# Patient Record
Sex: Male | Born: 1970 | Race: White | Hispanic: No | Marital: Single | Smoking: Never smoker
Health system: Southern US, Community
[De-identification: ages and names within clinical notes are randomized; demographics above are authoritative.]

## PROBLEM LIST (undated history)

## (undated) DIAGNOSIS — J45909 Unspecified asthma, uncomplicated: Secondary | ICD-10-CM

## (undated) HISTORY — PX: WISDOM TOOTH EXTRACTION: SHX21

---

## 2006-02-13 ENCOUNTER — Ambulatory Visit: Payer: Self-pay | Admitting: Internal Medicine

## 2006-02-25 ENCOUNTER — Ambulatory Visit: Payer: Self-pay | Admitting: Urology

## 2006-03-06 ENCOUNTER — Ambulatory Visit: Payer: Self-pay | Admitting: Urology

## 2006-03-12 ENCOUNTER — Ambulatory Visit: Payer: Self-pay | Admitting: Internal Medicine

## 2015-06-28 ENCOUNTER — Other Ambulatory Visit: Payer: Self-pay | Admitting: Family Medicine

## 2015-06-28 ENCOUNTER — Ambulatory Visit
Admission: RE | Admit: 2015-06-28 | Discharge: 2015-06-28 | Disposition: A | Discharge status: Home / Self Care | Payer: BC Managed Care – PPO | Source: Ambulatory Visit | Attending: Family Medicine | Admitting: Family Medicine

## 2015-06-28 DIAGNOSIS — R059 Cough, unspecified: Secondary | ICD-10-CM

## 2015-06-28 DIAGNOSIS — R05 Cough: Secondary | ICD-10-CM

## 2015-06-28 DIAGNOSIS — R0602 Shortness of breath: Secondary | ICD-10-CM | POA: Insufficient documentation

## 2015-10-03 ENCOUNTER — Emergency Department
Admission: EM | Admit: 2015-10-03 | Discharge: 2015-10-04 | Disposition: A | Discharge status: Home / Self Care | Payer: BC Managed Care – PPO | Attending: Emergency Medicine | Admitting: Emergency Medicine

## 2015-10-03 ENCOUNTER — Emergency Department: Payer: BC Managed Care – PPO

## 2015-10-03 DIAGNOSIS — R0789 Other chest pain: Secondary | ICD-10-CM | POA: Insufficient documentation

## 2015-10-03 LAB — CBC WITH DIFFERENTIAL/PLATELET
BASOS ABS: 0.1 10*3/uL (ref 0–0.1)
Basophils Relative: 1 %
EOS PCT: 2 %
Eosinophils Absolute: 0.2 10*3/uL (ref 0–0.7)
HEMATOCRIT: 37.6 % — AB (ref 40.0–52.0)
HEMOGLOBIN: 12.5 g/dL — AB (ref 13.0–18.0)
LYMPHS ABS: 2.9 10*3/uL (ref 1.0–3.6)
LYMPHS PCT: 23 %
MCH: 29.4 pg (ref 26.0–34.0)
MCHC: 33.3 g/dL (ref 32.0–36.0)
MCV: 88.2 fL (ref 80.0–100.0)
Monocytes Absolute: 1 10*3/uL (ref 0.2–1.0)
Monocytes Relative: 8 %
NEUTROS ABS: 8.3 10*3/uL — AB (ref 1.4–6.5)
NEUTROS PCT: 66 %
PLATELETS: 248 10*3/uL (ref 150–440)
RBC: 4.26 MIL/uL — AB (ref 4.40–5.90)
RDW: 13.1 % (ref 11.5–14.5)
WBC: 12.7 10*3/uL — AB (ref 3.8–10.6)

## 2015-10-03 LAB — BASIC METABOLIC PANEL
ANION GAP: 9 (ref 5–15)
BUN: 21 mg/dL — AB (ref 6–20)
CHLORIDE: 104 mmol/L (ref 101–111)
CO2: 22 mmol/L (ref 22–32)
Calcium: 8.9 mg/dL (ref 8.9–10.3)
Creatinine, Ser: 1.17 mg/dL (ref 0.61–1.24)
GFR calc Af Amer: >60 mL/min (ref >60)
Glucose, Bld: 98 mg/dL (ref 65–99)
POTASSIUM: 3.9 mmol/L (ref 3.5–5.1)
SODIUM: 135 mmol/L (ref 135–145)

## 2015-10-03 LAB — TROPONIN I: Troponin I: <0.03 ng/mL (ref <0.031)

## 2015-10-03 MED ORDER — SODIUM CHLORIDE 0.9 % IV BOLUS (SEPSIS)
1000.0000 mL | Freq: Once | INTRAVENOUS | Status: AC
  Administered 2015-10-03: 1000 mL via INTRAVENOUS

## 2015-10-03 MED ORDER — ASPIRIN 81 MG PO CHEW
324.0000 mg | CHEWABLE_TABLET | Freq: Once | ORAL | Status: AC
  Administered 2015-10-03: 324 mg via ORAL
  Filled 2015-10-03: qty 4

## 2015-10-03 NOTE — ED Notes (Signed)
Pt reports he was at church and reports CP and dizziness. Pt in no acute distress, MD at bedside

## 2015-10-03 NOTE — ED Provider Notes (Signed)
[facility] Emergency Department Provider Note ____________________________________________  Time seen: Approximately 9:44 PM  I have reviewed the triage vital signs and the nursing notes.   HISTORY  Chief Complaint Dizziness   HPI Dwayne Hooper is a 45 y.o. male with history of hypertension who presents with chest pain that started at 8 PM while he was sitting in church. The chest pain was left-sided and felt like sharp "pulsations"; it was intermittent for about 30 minutes. It did not radiate. He did have some shortness of breath but denies nausea and diaphoresis. He went to urinate and washed his face then went out to his car and felt dizzy so he returned to church and sat down. He is pain-free at this time but still feels lightheaded. He has never had similar symptoms in the past.  He has been drinking plenty of water but has been eating last and attempt to lose weight. He was at work earlier; works in [employer] but was inside.  No past medical history on file.  There are no active problems to display for this patient.   No past surgical history on file.  No current outpatient prescriptions on file.  Allergies Review of patient's allergies indicates no known allergies.  No family history on file.  Social History Social History  Substance Use Topics  . Smoking status: Not on file  . Smokeless tobacco: Not on file  . Alcohol Use: Not on file    Review of Systems Constitutional: No fever Eyes: No visual changes. ENT: No sore throat. Cardiovascular: Denies chest pain. Respiratory: Denies shortness of breath. Gastrointestinal: No abdominal pain.  No nausea, no vomiting.  No diarrhea.  No constipation. Genitourinary: Negative for dysuria. Musculoskeletal: Negative for back pain. Skin: Negative for rash. Neurological: Negative for headaches, focal weakness or numbness.  10-point ROS otherwise  negative.  ____________________________________________   PHYSICAL EXAM:  VITAL SIGNS:  Filed Vitals:   10/03/15 2155 10/03/15 2230  BP: 127/77 120/70  Pulse: 75 59  Resp: 18 21   Constitutional: Alert and oriented. Well appearing and in no acute distress. Eyes: Conjunctivae are normal. PERRL. EOMI. Head: Atraumatic. Nose: No congestion/rhinnorhea. Mouth/Throat: Mucous membranes are moist.  Oropharynx non-erythematous. Neck: No stridor.   Cardiovascular: Normal rate, regular rhythm. Grossly normal heart sounds.  Good peripheral circulation. Respiratory: Normal respiratory effort.  No retractions. Lungs CTAB. Gastrointestinal: Soft and nontender. No distention. No abdominal bruits. No CVA tenderness. Musculoskeletal: No lower extremity tenderness nor edema.   Neurologic:  Normal speech and language. No gross focal neurologic deficits are appreciated. No gait instability. Skin:  Skin is warm, dry and intact. No rash noted. Psychiatric: Mood and affect are normal. Speech and behavior are normal.  ____________________________________________   LABS (all labs ordered are listed, but only abnormal results are displayed)  Labs Reviewed  BASIC METABOLIC PANEL - Abnormal; Notable for the following:    BUN 21 (*)    All other components within normal limits  CBC WITH DIFFERENTIAL/PLATELET - Abnormal; Notable for the following:    WBC 12.7 (*)    RBC 4.26 (*)    Hemoglobin 12.5 (*)    HCT 37.6 (*)    Neutro Abs 8.3 (*)    All other components within normal limits  TROPONIN I   ____________________________________________  EKG  ED ECG REPORT I, Niamya Vittitow,  Rozetta Stumpp, the attending physician, personally viewed and interpreted this ECG.   Date: 10/03/2015  EKG Time: 2145  Rate: 73  Rhythm:  nsr  Axis: nl  Intervals:nl  ST&T Change: none  ____________________________________________  RADIOLOGY  cxr-IMPRESSION: No active cardiopulmonary  disease.  ____________________________________________   INITIAL IMPRESSION / ASSESSMENT AND PLAN / ED COURSE  Pertinent labs & imaging results that were available during my care of the patient were reviewed by me and considered in my medical decision making (see chart for details).  ----------------------------------------- 11:33 PM on 10/03/2015 -----------------------------------------  Patient remains pain-free. Will repeat troponin 3 hours after initial troponin which will be at 1 AM. ____________________________________________   FINAL CLINICAL IMPRESSION(S) / ED DIAGNOSES Chest pain  New prescriptions started this visit New Prescriptions   No medications on file     Ponciano Ort, MD 10/04/15 0030

## 2015-10-04 LAB — TROPONIN I

## 2015-10-04 NOTE — ED Provider Notes (Signed)
-----------------------------------------   2:04 AM on 10/04/2015 -----------------------------------------  Repeat troponin remains negative. Patient resting comfortably without complaints. Will refer for outpatient cardiology follow-up. Strict return precautions given. Patient verbalizes understanding and agrees with plan of care.  Paulette Blanch, MD 10/04/15 [phone]

## 2015-10-04 NOTE — Discharge Instructions (Signed)
1. Continue all medicines as directed by your doctor. 2. Return to the ER for worsening symptoms, persistent vomiting, difficulty breathing or other concerns.  Nonspecific Chest Pain  Chest pain can be caused by many different conditions. There is always a chance that your pain could be related to something serious, such as a heart attack or a blood clot in your lungs. Chest pain can also be caused by conditions that are not life-threatening. If you have chest pain, it is very important to follow up with your health care provider. CAUSES  Chest pain can be caused by:  Heartburn.  Pneumonia or bronchitis.  Anxiety or stress.  Inflammation around your heart (pericarditis) or lung (pleuritis or pleurisy).  A blood clot in your lung.  A collapsed lung (pneumothorax). It can develop suddenly on its own (spontaneous pneumothorax) or from trauma to the chest.  Shingles infection (varicella-zoster virus).  Heart attack.  Damage to the bones, muscles, and cartilage that make up your chest wall. This can include:  Bruised bones due to injury.  Strained muscles or cartilage due to frequent or repeated coughing or overwork.  Fracture to one or more ribs.  Sore cartilage due to inflammation (costochondritis). RISK FACTORS  Risk factors for chest pain may include:  Activities that increase your risk for trauma or injury to your chest.  Respiratory infections or conditions that cause frequent coughing.  Medical conditions or overeating that can cause heartburn.  Heart disease or family history of heart disease.  Conditions or health behaviors that increase your risk of developing a blood clot.  Having had chicken pox (varicella zoster). SIGNS AND SYMPTOMS Chest pain can feel like:  Burning or tingling on the surface of your chest or deep in your chest.  Crushing, pressure, aching, or squeezing pain.  Dull or sharp pain that is worse when you move, cough, or take a deep  breath.  Pain that is also felt in your back, neck, shoulder, or arm, or pain that spreads to any of these areas. Your chest pain may come and go, or it may stay constant. DIAGNOSIS Lab tests or other studies may be needed to find the cause of your pain. Your health care provider may have you take a test called an ambulatory ECG (electrocardiogram). An ECG records your heartbeat patterns at the time the test is performed. You may also have other tests, such as:  Transthoracic echocardiogram (TTE). During echocardiography, sound waves are used to create a picture of all of the heart structures and to look at how blood flows through your heart.  Transesophageal echocardiogram (TEE).This is a more advanced imaging test that obtains images from inside your body. It allows your health care provider to see your heart in finer detail.  Cardiac monitoring. This allows your health care provider to monitor your heart rate and rhythm in real time.  Holter monitor. This is a portable device that records your heartbeat and can help to diagnose abnormal heartbeats. It allows your health care provider to track your heart activity for several days, if needed.  Stress tests. These can be done through exercise or by taking medicine that makes your heart beat more quickly.  Blood tests.  Imaging tests. TREATMENT  Your treatment depends on what is causing your chest pain. Treatment may include:  Medicines. These may include:  Acid blockers for heartburn.  Anti-inflammatory medicine.  Pain medicine for inflammatory conditions.  Antibiotic medicine, if an infection is present.  Medicines to dissolve blood clots.  Medicines to treat coronary artery disease.  Supportive care for conditions that do not require medicines. This may include:  Resting.  Applying heat or cold packs to injured areas.  Limiting activities until pain decreases. HOME CARE INSTRUCTIONS  If you were prescribed an  antibiotic medicine, finish it all even if you start to feel better.  Avoid any activities that bring on chest pain.  Do not use any tobacco products, including cigarettes, chewing tobacco, or electronic cigarettes. If you need help quitting, ask your health care provider.  Do not drink alcohol.  Take medicines only as directed by your health care provider.  Keep all follow-up visits as directed by your health care provider. This is important. This includes any further testing if your chest pain does not go away.  If heartburn is the cause for your chest pain, you may be told to keep your head raised (elevated) while sleeping. This reduces the chance that acid will go from your stomach into your esophagus.  Make lifestyle changes as directed by your health care provider. These may include:  Getting regular exercise. Ask your health care provider to suggest some activities that are safe for you.  Eating a heart-healthy diet. A registered dietitian can help you to learn healthy eating options.  Maintaining a healthy weight.  Managing diabetes, if necessary.  Reducing stress. SEEK MEDICAL CARE IF:  Your chest pain does not go away after treatment.  You have a rash with blisters on your chest.  You have a fever. SEEK IMMEDIATE MEDICAL CARE IF:   Your chest pain is worse.  You have an increasing cough, or you cough up blood.  You have severe abdominal pain.  You have severe weakness.  You faint.  You have chills.  You have sudden, unexplained chest discomfort.  You have sudden, unexplained discomfort in your arms, back, neck, or jaw.  You have shortness of breath at any time.  You suddenly start to sweat, or your skin gets clammy.  You feel nauseous or you vomit.  You suddenly feel light-headed or dizzy.  Your heart begins to beat quickly, or it feels like it is skipping beats. These symptoms may represent a serious problem that is an emergency. Do not wait to  see if the symptoms will go away. Get medical help right away. Call your local emergency services (911 in the [place] Do not drive yourself to the hospital.   This information is not intended to replace advice given to you by your health care provider. Make sure you discuss any questions you have with your health care provider.   Document Released: 03/26/2005 Document Revised: 07/07/2014 Document Reviewed: 01/20/2014 Elsevier Interactive Patient Education [employer].

## 2016-10-28 ENCOUNTER — Ambulatory Visit
Admission: EM | Admit: 2016-10-28 | Discharge: 2016-10-28 | Disposition: A | Discharge status: Home / Self Care | Payer: BC Managed Care – PPO | Attending: Family Medicine | Admitting: Family Medicine

## 2016-10-28 ENCOUNTER — Ambulatory Visit (INDEPENDENT_AMBULATORY_CARE_PROVIDER_SITE_OTHER): Payer: BC Managed Care – PPO

## 2016-10-28 DIAGNOSIS — M1711 Unilateral primary osteoarthritis, right knee: Secondary | ICD-10-CM

## 2016-10-28 MED ORDER — NAPROXEN 500 MG PO TABS: 500.0000 mg | ORAL_TABLET | Freq: Two times a day (BID) | ORAL | 0 refills | Status: AC

## 2016-10-28 NOTE — ED Provider Notes (Signed)
CSN: [id number]     Arrival date & time 10/28/16  1222 History   None    Chief Complaint  Patient presents with  . Knee Pain    right   (Consider location/radiation/quality/duration/timing/severity/associated sxs/prior Treatment) HPI  46 year old male who presents with a two-week history of right lateral knee pain. He denies any injury to the knee. Works in [employer] does a lot of climbing on [employer]. 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 He states his knee will pop on occasion it has not locked. He has not noticed much swelling. Any pressure on the knee causes him to have pain ; his gait has developed a decided limp. He is morbidly obese and has recently started an exercise program on a treadmill and lifting weights in an effort to lose weight. His knee hurt before the exercise program but has worsened since beginning exercise program.        History reviewed. No pertinent past medical history. History reviewed. No pertinent surgical history. Family History  Problem Relation Age of Onset  . Healthy Mother   . Healthy Father    Social History  Substance Use Topics  . Smoking status: Never Smoker  . Smokeless tobacco: Current User    Types: Snuff  . Alcohol use Yes     Comment: occasionally    Review of Systems  Constitutional: Positive for activity change. Negative for appetite change, chills, fatigue and fever.  Musculoskeletal: Positive for arthralgias.  All other systems reviewed and are negative.   Allergies  Patient has no known allergies.  Home Medications   Prior to Admission medications   Medication Sig Start Date End Date Taking? Authorizing Provider  lisinopril-hydrochlorothiazide (PRINZIDE,ZESTORETIC) 10-12.5 MG tablet Take 1 tablet by mouth daily. 09/06/15  Yes Historical Provider, MD  Multiple Vitamins-Minerals (MULTIVITAMIN WITH MINERALS) tablet Take 1 tablet by mouth daily.   Yes Historical Provider, MD  naproxen (NAPROSYN) 500 MG  tablet Take 1 tablet (500 mg total) by mouth 2 (two) times daily with a meal. 10/28/16   Lorin Picket, PA-C   Meds Ordered and Administered this Visit  Medications - No data to display  BP 120/72 (BP Location: Left Arm)   Pulse 61   Temp 98.7 F (37.1 C) (Oral)   Resp 17   Ht 5\' 11"  (1.803 m)   Wt (!) 335 lb (152 kg)   SpO2 98%   BMI 46.72 kg/m  No data found.   Physical Exam  Constitutional: He is oriented to person, place, and time. He appears well-developed and well-nourished. No distress.  HENT:  Head: Normocephalic and atraumatic.  Eyes: Pupils are equal, round, and reactive to light. Right eye exhibits no discharge. Left eye exhibits no discharge.  Neck: Normal range of motion.  Musculoskeletal: Normal range of motion. He exhibits tenderness. He exhibits no deformity.  Examination of the right knee shows no obvious effusion. He has  good quad control and strength. Patellofemoral joint is negative. Collateral ligaments are strong to testing. Patient's  maximum tenderness is over the lateral joint line of the right knee which reproduces pain. Flexion is full but with pain at the extremes.; he has discomfort at the extremes of extension. There is a positive McMurray's sign. Anterior drawer sign is negative. He does exhibit an antalgic gait.  Neurological: He is alert and oriented to person, place, and time.  Skin: Skin is warm and dry. He is not diaphoretic.  Psychiatric: He has  a normal mood and affect. His behavior is normal. Judgment and thought content normal.  Nursing note and vitals reviewed.   Urgent Care Course     Procedures (including critical care time)  Labs Review Labs Reviewed - No data to display  Imaging Review Dg Knee Complete 4 Views Right  Result Date: 10/28/2016 CLINICAL DATA:  46 year old male with 2 weeks of right knee pain with no known injury. Lateral joint region pain. EXAM: RIGHT KNEE - COMPLETE 4+ VIEW COMPARISON:  None. FINDINGS:  Weight-bearing views. Bone mineralization is within normal limits. Patella is intact. There is mild medial compartment joint space loss. There is minimal to mild tricompartmental degenerative spurring. Questionable trace joint effusion. No acute osseous abnormality identified. IMPRESSION: Mild right knee joint degeneration, maximal in the medial compartment. Possible trace joint effusion but no acute osseous abnormality identified. Electronically Signed   By: Genevie Ann M.D.   On: 10/28/2016 14:07     Visual Acuity Review  Right Eye Distance:   Left Eye Distance:   Bilateral Distance:    Right Eye Near:   Left Eye Near:    Bilateral Near:      The patient was fitted with a knee immobilizer   MDM   1. Primary osteoarthritis of right knee    Discharge Medication List as of 10/28/2016  2:39 PM    START taking these medications   Details  naproxen (NAPROSYN) 500 MG tablet Take 1 tablet (500 mg total) by mouth 2 (two) times daily with a meal., Starting Tue 10/28/2016, Normal      Plan: 1. Test/x-ray results and diagnosis reviewed with patient 2. rx as per orders; risks, benefits, potential side effects reviewed with patient 3. Recommend supportive treatment with Use of a knee immobilizer during activity.May remove the knee immobilizer when not active..Stop using ibuprofen and begin using the Naprosyn that I prescribed. Apply ice to the knee 20 minutes out of every 2 hours for comfort. If you are still having problems after a week of conservative care suggests follow-up with the orthopedic surgeon. Name and address were provided to the patient.   Lorin Picket, PA-C 10/28/16 1445

## 2016-10-28 NOTE — ED Triage Notes (Signed)
Patient complains of right knee pain. Patient states that pain is on the right side of right knee. Patient states that pain has started 2 weeks ago.

## 2016-11-03 DIAGNOSIS — M170 Bilateral primary osteoarthritis of knee: Secondary | ICD-10-CM | POA: Insufficient documentation

## 2016-11-03 DIAGNOSIS — M179 Osteoarthritis of knee, unspecified: Secondary | ICD-10-CM | POA: Insufficient documentation

## 2017-06-17 ENCOUNTER — Other Ambulatory Visit: Payer: Self-pay | Admitting: Family Medicine

## 2017-06-17 DIAGNOSIS — R1011 Right upper quadrant pain: Secondary | ICD-10-CM

## 2017-06-19 ENCOUNTER — Ambulatory Visit: Payer: BC Managed Care – PPO

## 2018-06-16 IMAGING — CR DG KNEE COMPLETE 4+V*R*
4 series · 4 of 4 positions shown · non-contrast
Comparison: None.

CLINICAL DATA: 46-year-old male with 2 weeks of right knee pain
with no known injury. Lateral joint region pain.

EXAM:
RIGHT KNEE - COMPLETE 4+ VIEW

[knee ap]
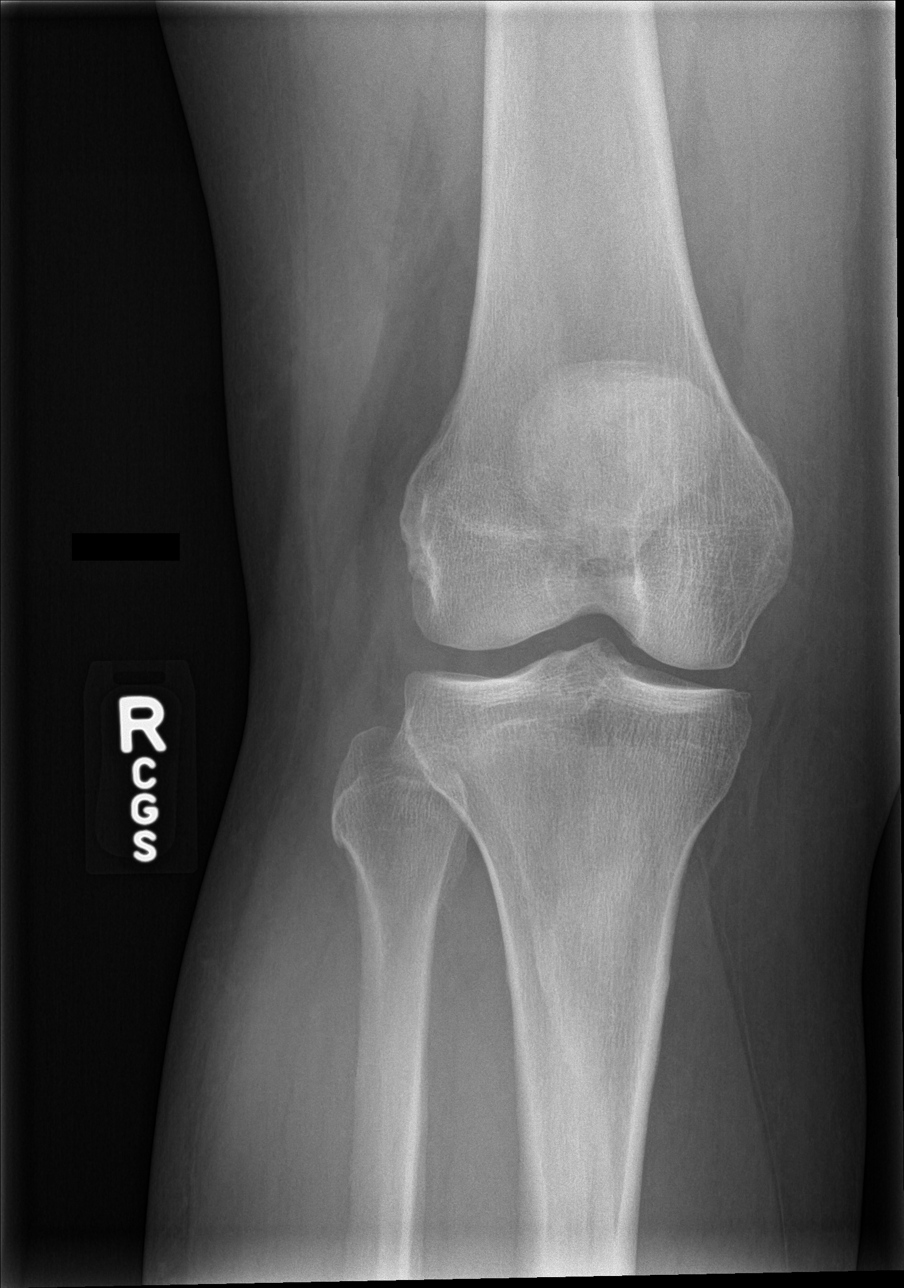

[knee lat]
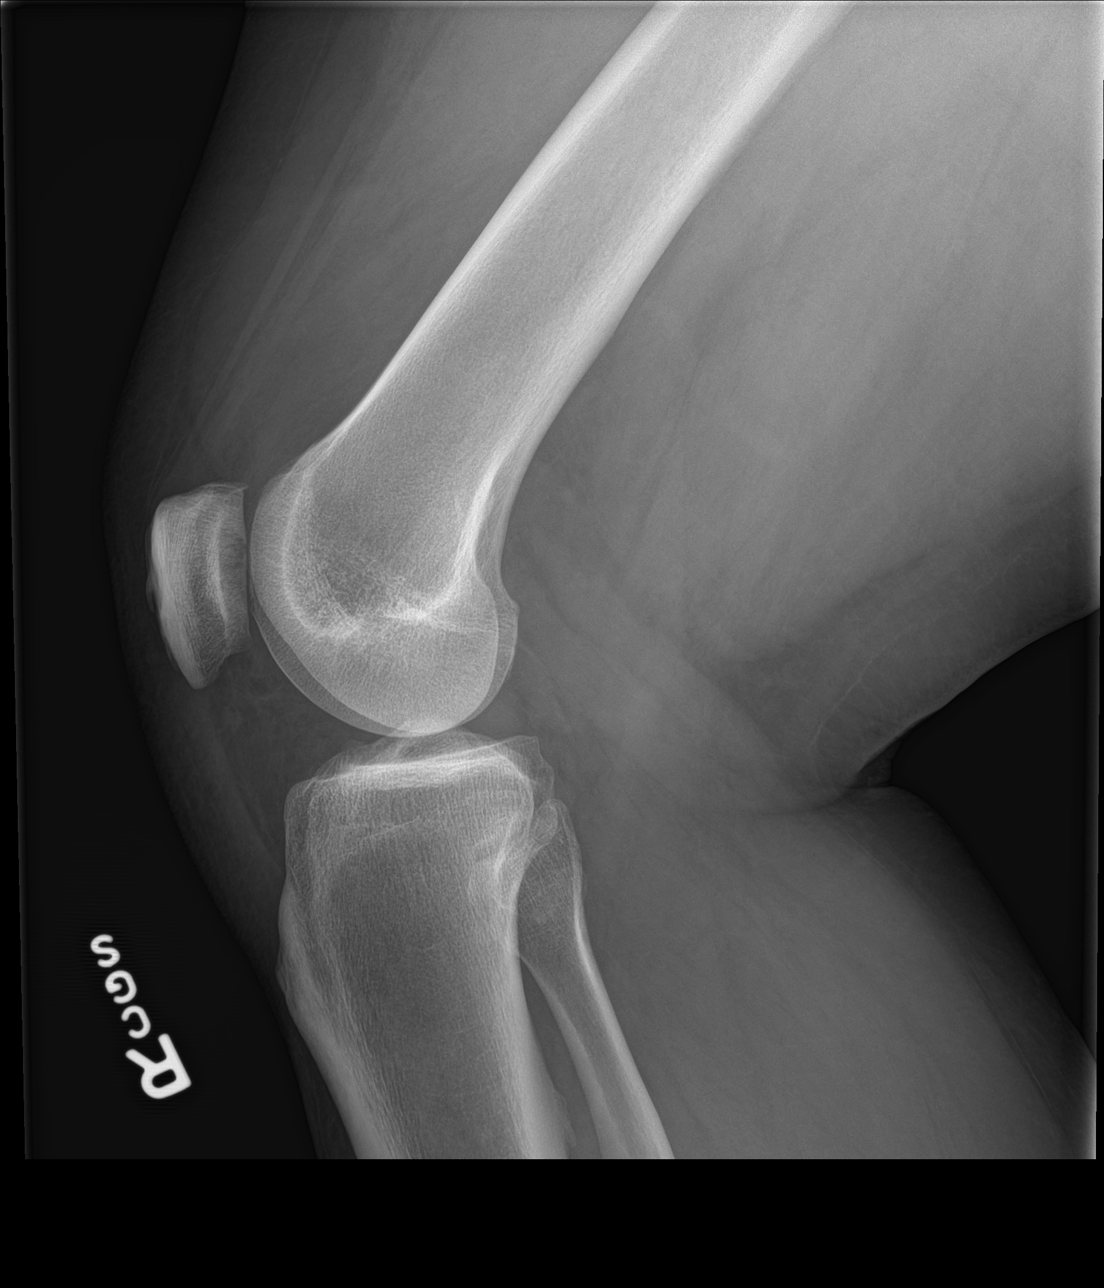

[tunnel]
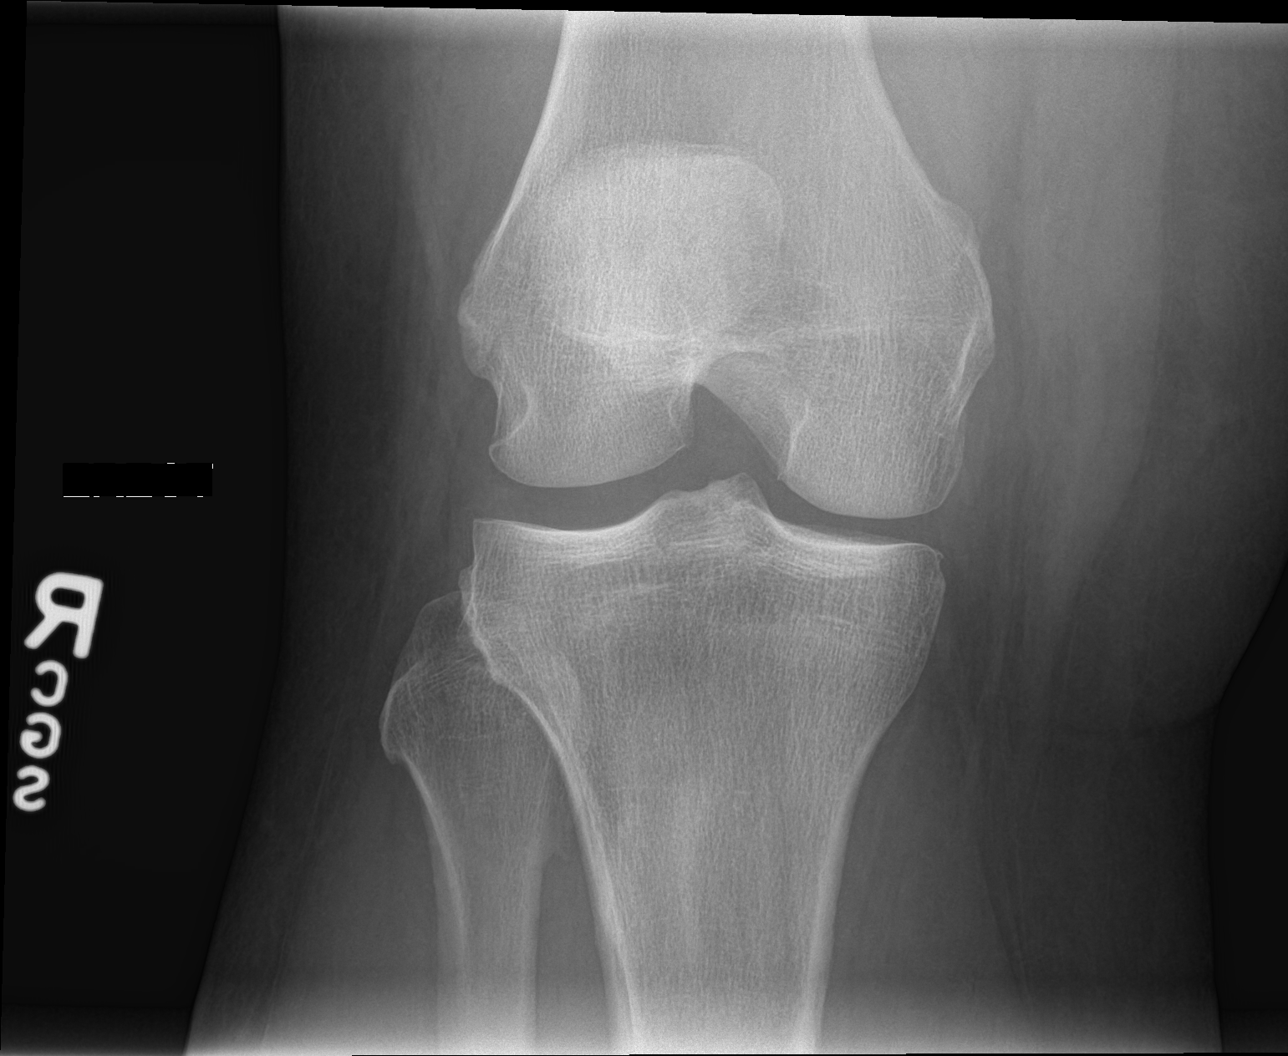

[patella skyline]
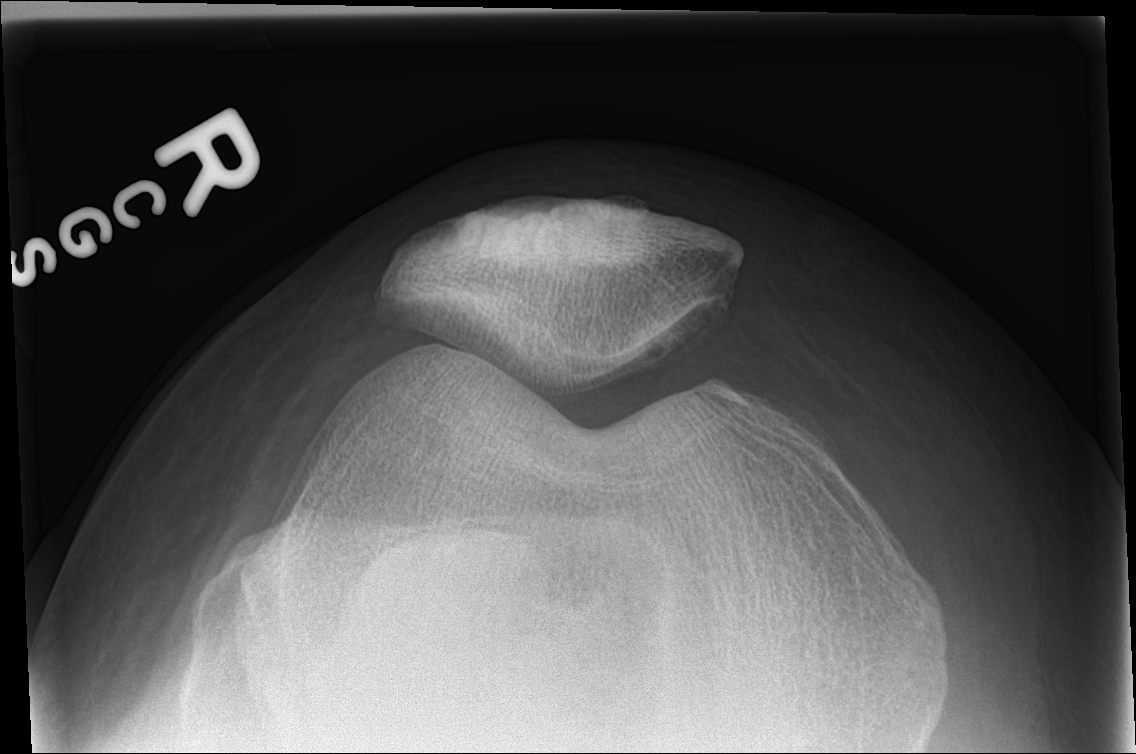

[4 of 4 positions shown; findings below may reference images not displayed]

FINDINGS: Weight-bearing views. Bone mineralization is within normal limits.
Patella is intact. There is mild medial compartment joint space
loss. There is minimal to mild tricompartmental degenerative
spurring. Questionable trace joint effusion. No acute osseous
abnormality identified.
IMPRESSION: Mild right knee joint degeneration, maximal in the medial
compartment. Possible trace joint effusion but no acute osseous
abnormality identified.

## 2021-05-01 ENCOUNTER — Ambulatory Visit
Admission: EM | Admit: 2021-05-01 | Discharge: 2021-05-01 | Disposition: A | Discharge status: Home / Self Care | Payer: BC Managed Care – PPO | Attending: Physician Assistant | Admitting: Physician Assistant

## 2021-05-01 ENCOUNTER — Encounter: Payer: Self-pay | Admitting: Emergency Medicine

## 2021-05-01 ENCOUNTER — Other Ambulatory Visit: Payer: Self-pay

## 2021-05-01 DIAGNOSIS — R051 Acute cough: Secondary | ICD-10-CM | POA: Insufficient documentation

## 2021-05-01 DIAGNOSIS — R062 Wheezing: Secondary | ICD-10-CM

## 2021-05-01 DIAGNOSIS — Z20822 Contact with and (suspected) exposure to covid-19: Secondary | ICD-10-CM | POA: Diagnosis not present

## 2021-05-01 DIAGNOSIS — F17220 Nicotine dependence, chewing tobacco, uncomplicated: Secondary | ICD-10-CM | POA: Insufficient documentation

## 2021-05-01 DIAGNOSIS — R059 Cough, unspecified: Secondary | ICD-10-CM

## 2021-05-01 DIAGNOSIS — Z791 Long term (current) use of non-steroidal anti-inflammatories (NSAID): Secondary | ICD-10-CM | POA: Diagnosis not present

## 2021-05-01 HISTORY — DX: Unspecified asthma, uncomplicated: J45.909

## 2021-05-01 LAB — SARS CORONAVIRUS 2 (TAT 6-24 HRS): SARS Coronavirus 2: NEGATIVE

## 2021-05-01 MED ORDER — METHYLPREDNISOLONE 4 MG PO TBPK: ORAL_TABLET | ORAL | 0 refills | Status: AC

## 2021-05-01 MED ORDER — ALBUTEROL SULFATE HFA 108 (90 BASE) MCG/ACT IN AERS: 1.0000 | INHALATION_SPRAY | Freq: Four times a day (QID) | RESPIRATORY_TRACT | 0 refills | Status: AC | PRN

## 2021-05-01 MED ORDER — METHYLPREDNISOLONE SODIUM SUCC 40 MG IJ SOLR
40.0000 mg | Freq: Once | INTRAMUSCULAR | Status: AC
  Administered 2021-05-01: 40 mg via INTRAMUSCULAR

## 2021-05-01 NOTE — ED Triage Notes (Signed)
Pt presents today with c/o of chest congestion, cough and wheezing that began 5 days ago. Home Covid test yesterday negative. Denies fever.

## 2021-05-01 NOTE — ED Provider Notes (Signed)
[facility]    CSN: [id number] Arrival date & time: 05/01/21  0813      History   Chief Complaint Chief Complaint  Patient presents with   Cough   Wheezing    HPI Dwayne Hooper is a 50 y.o. male who presents with onset of chest congestion and cough and wheezing x 5 days. Has not had a fever. Had negative covid test yesterday. Tends to get wheezing in the fall and has been using his neb q 4h x 2 days which helps. When he gets this way his PCP has given him steroid shot and PO prednisone. But his PCP is out this week. Has been pushing more fluids and coughing seems to help with wheezing.     Past Medical History:  Diagnosis Date   Asthma     There are no problems to display for this patient.   History reviewed. No pertinent surgical history.     Home Medications    Prior to Admission medications   Medication Sig Start Date End Date Taking? Authorizing Provider  albuterol (VENTOLIN HFA) 108 (90 Base) MCG/ACT inhaler Inhale 1-2 puffs into the lungs every 6 (six) hours as needed for wheezing or shortness of breath. 05/01/21  Yes Rodriguez-Southworth, Sunday Spillers, PA-C  methylPREDNISolone (MEDROL DOSEPAK) 4 MG TBPK tablet Take as directed 05/01/21  Yes Rodriguez-Southworth, Sunday Spillers, PA-C  lisinopril-hydrochlorothiazide (PRINZIDE,ZESTORETIC) 10-12.5 MG tablet Take 1 tablet by mouth daily. 09/06/15   [provider]  Multiple Vitamins-Minerals (MULTIVITAMIN WITH MINERALS) tablet Take 1 tablet by mouth daily.    [provider]  naproxen (NAPROSYN) 500 MG tablet Take 1 tablet (500 mg total) by mouth 2 (two) times daily with a meal. 10/28/16   Lorin Picket, PA-C    Family History Family History  Problem Relation Age of Onset   Healthy Mother    Healthy Father     Social History Social History   Tobacco Use   Smoking status: Never   Smokeless tobacco: Current    Types: Snuff  Substance Use Topics   Alcohol use: Yes    Comment: occasionally    Drug use: No     Allergies   Patient has no known allergies.   Review of Systems Review of Systems  Constitutional:  Negative for appetite change, chills, diaphoresis, fatigue and fever.  HENT:  Positive for postnasal drip, rhinorrhea and sneezing. Negative for congestion, ear discharge, ear pain and sore throat.   Eyes:  Negative for discharge.  Respiratory:  Positive for cough, shortness of breath and wheezing.   Musculoskeletal:  Negative for myalgias.  Skin:  Negative for rash.  Neurological:  Negative for headaches.    Physical Exam Triage Vital Signs ED Triage Vitals  Enc Vitals Group     BP 05/01/21 0826 123/82     Pulse Rate 05/01/21 0826 71     Resp 05/01/21 0826 18     Temp 05/01/21 0826 98.9 F (37.2 C)     Temp Source 05/01/21 0826 Oral     SpO2 05/01/21 0826 95 %     Weight --      Height --      Head Circumference --      Peak Flow --      Pain Score 05/01/21 0824 0     Pain Loc --      Pain Edu? --      Excl. in [place]? --    No data found.  Updated Vital Signs  BP 123/82 (BP Location: Right Arm)   Pulse 71   Temp 98.9 F (37.2 C) (Oral)   Resp 18   SpO2 95%   Visual Acuity Right Eye Distance:   Left Eye Distance:   Bilateral Distance:    Right Eye Near:   Left Eye Near:    Bilateral Near:     Physical Exam Physical Exam Constitutional:      General: He is not in acute distress.    Appearance: He is not toxic-appearing.  HENT:     Head: Normocephalic.     Right Ear: Tympanic membrane, ear canal and external ear normal.     Left Ear: Ear canal and external ear normal.     Nose: Nose normal.     Mouth/Throat:     Mouth: Mucous membranes are moist.     Pharynx: Oropharynx is clear.  Eyes:     General: No scleral icterus.    Conjunctiva/sclera: Conjunctivae normal.  Cardiovascular:     Rate and Rhythm: Normal rate and regular rhythm.     Heart sounds: No murmur heard.   Pulmonary:     Effort: Pulmonary effort is normal. No  respiratory distress.     Breath sounds: expiratory Wheezing present all out, but improve after coughing.      Musculoskeletal:        General: Normal range of motion.     Cervical back: Neck supple.  Lymphadenopathy:     Cervical: No cervical adenopathy.  Skin:    General: Skin is warm and dry.     Findings: No rash.  Neurological:     Mental Status: He is alert and oriented to person, place, and time.     Gait: Gait normal.  Psychiatric:        Mood and Affect: Mood normal.        Behavior: Behavior normal.        Thought Content: Thought content normal.        Judgment: Judgment normal.    UC Treatments / Results  Labs (all labs ordered are listed, but only abnormal results are displayed) Labs Reviewed  SARS CORONAVIRUS 2 (TAT 6-24 HRS)    EKG   Radiology No results found.  Procedures Procedures (including critical care time)  Medications Ordered in UC Medications  methylPREDNISolone sodium succinate (SOLU-MEDROL) 40 mg/mL injection 40 mg (has no administration in time range)    Initial Impression / Assessment and Plan / UC Course  I have reviewed the triage vital signs and the nursing notes. Has allergic bronchitis He was given Solumedrol 40 mg IM, and I sent Medrol dose pack. See instructions.   Final Clinical Impressions(s) / UC Diagnoses   Final diagnoses:  Wheezing  Cough, unspecified type     Discharge Instructions      Continue the nebulizer treatments every 4 hours for 5 more days and then as needed. I am sending an inhaler for you to have when you are at work.  For next year try Claritin one month before his happens and see if it prevents it from happening      ED Prescriptions     Medication Sig Dispense Auth. Provider   methylPREDNISolone (MEDROL DOSEPAK) 4 MG TBPK tablet Take as directed 21 tablet Rodriguez-Southworth, Sunday Spillers, PA-C   albuterol (VENTOLIN HFA) 108 (90 Base) MCG/ACT inhaler Inhale 1-2 puffs into the lungs every 6 (six)  hours as needed for wheezing or shortness of breath. 16 g Rodriguez-Southworth, [place], [place]  PDMP not reviewed this encounter.   Shelby Mattocks, PA-C 05/01/21 0900

## 2021-05-01 NOTE — Discharge Instructions (Addendum)
Continue the nebulizer treatments every 4 hours for 5 more days and then as needed. I am sending an inhaler for you to have when you are at work.  For next year try Claritin one month before his happens and see if it prevents it from happening

## 2022-04-01 ENCOUNTER — Encounter: Payer: Self-pay | Admitting: Family Medicine

## 2022-04-01 ENCOUNTER — Telehealth: Payer: Self-pay

## 2022-04-01 ENCOUNTER — Other Ambulatory Visit: Payer: Self-pay

## 2022-04-01 DIAGNOSIS — Z1211 Encounter for screening for malignant neoplasm of colon: Secondary | ICD-10-CM

## 2022-04-01 DIAGNOSIS — R195 Other fecal abnormalities: Secondary | ICD-10-CM

## 2022-04-01 MED ORDER — NA SULFATE-K SULFATE-MG SULF 17.5-3.13-1.6 GM/177ML PO SOLN: 1.0000 | Freq: Once | ORAL | 0 refills | Status: AC

## 2022-04-01 NOTE — Telephone Encounter (Signed)
Gastroenterology Pre-Procedure Review  Request Date: 05/16/22 Requesting Physician: Dr. Marius Ditch  PATIENT REVIEW QUESTIONS: The patient responded to the following health history questions as indicated:    1. Are you having any GI issues? no GI Issues noticed by patient however, positive cologuard test 2. Do you have a personal history of Polyps? no 3. Do you have a family history of Colon Cancer or Polyps? no 4. Diabetes Mellitus? no 5. Joint replacements in the past 12 months?no 6. Major health problems in the past 3 months?no 7. Any artificial heart valves, MVP, or defibrillator?no    MEDICATIONS & ALLERGIES:    Patient reports the following regarding taking any anticoagulation/antiplatelet therapy:   Plavix, Coumadin, Eliquis, Xarelto, Lovenox, Pradaxa, Brilinta, or Effient? no Aspirin? no Patient confirms/reports the following medications:  Current Outpatient Medications  Medication Sig Dispense Refill   albuterol (VENTOLIN HFA) 108 (90 Base) MCG/ACT inhaler Inhale 1-2 puffs into the lungs every 6 (six) hours as needed for wheezing or shortness of breath. 16 g 0   lisinopril-hydrochlorothiazide (PRINZIDE,ZESTORETIC) 10-12.5 MG tablet Take 1 tablet by mouth daily.  1   methylPREDNISolone (MEDROL DOSEPAK) 4 MG TBPK tablet Take as directed 21 tablet 0   Multiple Vitamins-Minerals (MULTIVITAMIN WITH MINERALS) tablet Take 1 tablet by mouth daily.     naproxen (NAPROSYN) 500 MG tablet Take 1 tablet (500 mg total) by mouth 2 (two) times daily with a meal. 60 tablet 0   No current facility-administered medications for this visit.    Patient confirms/reports the following allergies:  No Known Allergies  No orders of the defined types were placed in this encounter.   AUTHORIZATION INFORMATION Primary Insurance: 1D#: Group #:  Secondary Insurance: 1D#: Group #:  SCHEDULE INFORMATION: Date: 05/16/22 Time: Location: [facility]

## 2022-05-16 ENCOUNTER — Ambulatory Visit
Admission: RE | Admit: 2022-05-16 | Discharge: 2022-05-16 | Disposition: A | Discharge status: Home / Self Care | Payer: BC Managed Care – PPO | Attending: Gastroenterology | Admitting: Gastroenterology

## 2022-05-16 ENCOUNTER — Encounter
Admission: RE | Disposition: A | Discharge status: Home / Self Care | Payer: Self-pay | Source: Home / Self Care | Attending: Gastroenterology

## 2022-05-16 ENCOUNTER — Other Ambulatory Visit: Payer: Self-pay

## 2022-05-16 ENCOUNTER — Ambulatory Visit: Payer: BC Managed Care – PPO | Admitting: Anesthesiology

## 2022-05-16 ENCOUNTER — Encounter: Payer: Self-pay | Admitting: Gastroenterology

## 2022-05-16 DIAGNOSIS — J45909 Unspecified asthma, uncomplicated: Secondary | ICD-10-CM | POA: Diagnosis not present

## 2022-05-16 DIAGNOSIS — Z1211 Encounter for screening for malignant neoplasm of colon: Secondary | ICD-10-CM | POA: Diagnosis present

## 2022-05-16 DIAGNOSIS — R195 Other fecal abnormalities: Secondary | ICD-10-CM

## 2022-05-16 DIAGNOSIS — K621 Rectal polyp: Secondary | ICD-10-CM | POA: Diagnosis not present

## 2022-05-16 DIAGNOSIS — D128 Benign neoplasm of rectum: Secondary | ICD-10-CM | POA: Insufficient documentation

## 2022-05-16 DIAGNOSIS — K635 Polyp of colon: Secondary | ICD-10-CM | POA: Diagnosis not present

## 2022-05-16 DIAGNOSIS — D120 Benign neoplasm of cecum: Secondary | ICD-10-CM | POA: Diagnosis not present

## 2022-05-16 DIAGNOSIS — D124 Benign neoplasm of descending colon: Secondary | ICD-10-CM | POA: Insufficient documentation

## 2022-05-16 DIAGNOSIS — Z6841 Body Mass Index (BMI) 40.0 and over, adult: Secondary | ICD-10-CM | POA: Insufficient documentation

## 2022-05-16 HISTORY — PX: COLONOSCOPY WITH PROPOFOL: SHX5780

## 2022-05-16 SURGERY — COLONOSCOPY WITH PROPOFOL
Anesthesia: General

## 2022-05-16 MED ORDER — PROPOFOL 10 MG/ML IV BOLUS
INTRAVENOUS | Status: DC | PRN
  Administered 2022-05-16: 80 mg via INTRAVENOUS

## 2022-05-16 MED ORDER — SODIUM CHLORIDE 0.9 % IV SOLN: INTRAVENOUS | Status: DC

## 2022-05-16 MED ORDER — LIDOCAINE HCL (CARDIAC) PF 100 MG/5ML IV SOSY
PREFILLED_SYRINGE | INTRAVENOUS | Status: DC | PRN
  Administered 2022-05-16: 50 mg via INTRAVENOUS

## 2022-05-16 MED ORDER — PROPOFOL 500 MG/50ML IV EMUL
INTRAVENOUS | Status: DC | PRN
  Administered 2022-05-16: 175 ug/kg/min via INTRAVENOUS

## 2022-05-16 MED ORDER — SIMETHICONE 40 MG/0.6ML PO SUSP
ORAL | Status: DC | PRN
  Administered 2022-05-16: 60 mL

## 2022-05-16 NOTE — Anesthesia Postprocedure Evaluation (Signed)
Anesthesia Post Note  Patient: Dwayne Hooper  Procedure(s) Performed: COLONOSCOPY WITH PROPOFOL  Patient location during evaluation: Endoscopy Anesthesia Type: General Level of consciousness: awake and alert Pain management: pain level controlled Vital Signs Assessment: post-procedure vital signs reviewed and stable Respiratory status: spontaneous breathing, nonlabored ventilation, respiratory function stable and patient connected to nasal cannula oxygen Cardiovascular status: blood pressure returned to baseline and stable Postop Assessment: no apparent nausea or vomiting Anesthetic complications: no   No notable events documented.   Last Vitals:  Vitals:   05/16/22 1034 05/16/22 1054  BP: 110/72 114/78  Pulse: 63   Resp: 18 14  Temp: (!) 35.7 C   SpO2: 99% 98%    Last Pain:  Vitals:   05/16/22 1054  TempSrc:   PainSc: 0-No pain                 Precious Haws Jourdon Zimmerle

## 2022-05-16 NOTE — H&P (Addendum)
Dwayne Darby, MD [address]  [place]  [place], [place] [address]  Main: [phone]  Fax: [phone] Pager: [phone]  Primary Care Physician:  Lynnell Jude, MD Primary Gastroenterologist:  Dr. Cephas Hooper  Pre-Procedure History & Physical: HPI:  Dwayne Hooper is a 51 y.o. male is here for an colonoscopy.   Past Medical History:  Diagnosis Date   Asthma     Past Surgical History:  Procedure Laterality Date   WISDOM TOOTH EXTRACTION      Prior to Admission medications   Medication Sig Start Date End Date Taking? Authorizing Provider  albuterol (VENTOLIN HFA) 108 (90 Base) MCG/ACT inhaler Inhale 1-2 puffs into the lungs every 6 (six) hours as needed for wheezing or shortness of breath. 05/01/21  Yes Rodriguez-Southworth, Sunday Spillers, PA-C  lisinopril-hydrochlorothiazide (PRINZIDE,ZESTORETIC) 10-12.5 MG tablet Take 1 tablet by mouth daily. 09/06/15  Yes [provider]  Multiple Vitamins-Minerals (MULTIVITAMIN WITH MINERALS) tablet Take 1 tablet by mouth daily.   Yes [provider]  naproxen (NAPROSYN) 500 MG tablet Take 1 tablet (500 mg total) by mouth 2 (two) times daily with a meal. 10/28/16  Yes Lorin Picket, PA-C  methylPREDNISolone (MEDROL DOSEPAK) 4 MG TBPK tablet Take as directed 05/01/21   Rodriguez-Southworth, Sunday Spillers, PA-C    Allergies as of 04/01/2022   (No Known Allergies)    Family History  Problem Relation Age of Onset   Healthy Mother    Healthy Father     Social History   Socioeconomic History   Marital status: Single    Spouse name: Not on file   Number of children: Not on file   Years of education: Not on file   Highest education level: Not on file  Occupational History   Not on file  Tobacco Use   Smoking status: Never   Smokeless tobacco: Current    Types: Snuff  Vaping Use   Vaping Use: Never used  Substance and Sexual Activity   Alcohol use: Yes    Comment: occasionally   Drug use: No   Sexual  activity: Not on file  Other Topics Concern   Not on file  Social History Narrative   Not on file   Social Determinants of Health   Financial Resource Strain: Not on file  Food Insecurity: Not on file  Transportation Needs: Not on file  Physical Activity: Not on file  Stress: Not on file  Social Connections: Not on file  Intimate Partner Violence: Not on file    Review of Systems: See HPI, otherwise negative ROS  Physical Exam: BP 110/72   Pulse 63   Temp (!) 96.2 F (35.7 C) (Temporal)   Resp 18   Ht '5\' 10"'$  (1.778 m)   Wt 129.3 kg   SpO2 99%   BMI 40.89 kg/m  General:   Alert,  pleasant and cooperative in NAD Head:  Normocephalic and atraumatic. Neck:  Supple; no masses or thyromegaly. Lungs:  Clear throughout to auscultation.    Heart:  Regular rate and rhythm. Abdomen:  Soft, nontender and nondistended. Normal bowel sounds, without guarding, and without rebound.   Neurologic:  Alert and  oriented x4;  grossly normal neurologically.  Impression/Plan: Dwayne Hooper is here for an colonoscopy to be performed for cologuard positive test  Risks, benefits, limitations, and alternatives regarding  colonoscopy have been reviewed with the patient.  Questions have been answered.  All parties agreeable.   Sherri Sear, MD  05/16/2022, 10:37 AM

## 2022-05-16 NOTE — Op Note (Addendum)
[facility] Gastroenterology Patient Name: Dwayne Hooper Procedure Date: 05/16/2022 9:58 AM MRN: [id number] Account #: [id number] Date of Birth: Sep 29, 1970 Admit Type: Outpatient Age: 51 Room: [facility] ENDO ROOM 2 Gender: Male Note Status: Finalized Instrument Name: Colonoscope [id number] Procedure:             Colonoscopy Indications:           This is the patient's first colonoscopy, Positive                         Cologuard test Providers:             Lin Landsman MD, MD Referring MD:          Lynnell Jude (Referring MD) Medicines:             General Anesthesia Complications:         No immediate complications. Estimated blood loss: None. Procedure:             Pre-Anesthesia Assessment:                        - Prior to the procedure, a History and Physical was                         performed, and patient medications and allergies were                         reviewed. The patient is competent. The risks and                         benefits of the procedure and the sedation options and                         risks were discussed with the patient. All questions                         were answered and informed consent was obtained.                         Patient identification and proposed procedure were                         verified by the physician, the nurse, the                         anesthesiologist, the anesthetist and the technician                         in the pre-procedure area in the procedure room in the                         endoscopy suite. Mental Status Examination: alert and                         oriented. Airway Examination: normal oropharyngeal                         airway and neck mobility. Respiratory Examination:  clear to auscultation. CV Examination: normal.                         Prophylactic Antibiotics: The patient does not require                         prophylactic antibiotics. Prior  Anticoagulants: The                         patient has taken no anticoagulant or antiplatelet                         agents. ASA Grade Assessment: III - A patient with                         severe systemic disease. After reviewing the risks and                         benefits, the patient was deemed in satisfactory                         condition to undergo the procedure. The anesthesia                         plan was to use general anesthesia. Immediately prior                         to administration of medications, the patient was                         re-assessed for adequacy to receive sedatives. The                         heart rate, respiratory rate, oxygen saturations,                         blood pressure, adequacy of pulmonary ventilation, and                         response to care were monitored throughout the                         procedure. The physical status of the patient was                         re-assessed after the procedure.                        After obtaining informed consent, the colonoscope was                         passed under direct vision. Throughout the procedure,                         the patient's blood pressure, pulse, and oxygen                         saturations were monitored continuously. The  Colonoscope was introduced through the anus and                         advanced to the the cecum, identified by appendiceal                         orifice and ileocecal valve. The colonoscopy was                         performed without difficulty. The patient tolerated                         the procedure well. The quality of the bowel                         preparation was evaluated using the BBPS [facility] Bowel                         Preparation Scale) with scores of: Right Colon = 3,                         Transverse Colon = 3 and Left Colon = 3 (entire mucosa                         seen well with no  residual staining, small fragments                         of stool or opaque liquid). The total BBPS score                         equals 9. The ileocecal valve, appendiceal orifice,                         and rectum were photographed. Findings:      The perianal and digital rectal examinations were normal. Pertinent       negatives include normal sphincter tone and no palpable rectal lesions.      An 8 mm polyp was found in the cecum. The polyp was sessile. The polyp       was removed with a cold snare. Resection and retrieval were complete.       Estimated blood loss was minimal. To prevent bleeding after the       polypectomy, two hemostatic clips were successfully placed (MR safe).       Clip manufacturer: [employer]. There was no bleeding at the end       of the procedure.      Five sessile polyps were found in the rectum, descending colon and       cecum. The polyps were 3 to 5 mm in size. These polyps were removed with       a cold snare. Resection and retrieval were complete. Estimated blood       loss: none.      A 10 mm polyp was found in the distal rectum. The polyp was       pedunculated. The polyp was removed with a hot snare. Resection and       retrieval were complete. Estimated blood loss: none.      The retroflexed view of  the distal rectum and anal verge was normal and       showed no anal or rectal abnormalities. Impression:            - One 8 mm polyp in the cecum, removed with a cold                         snare. Resected and retrieved. Clip manufacturer:                         [employer]. Clips (MR safe) were placed.                        - Five 3 to 5 mm polyps in the rectum, in the                         descending colon and in the cecum, removed with a cold                         snare. Resected and retrieved.                        - One 10 mm polyp in the distal rectum, removed with a                         hot snare. Resected and  retrieved.                        - The distal rectum and anal verge are normal on                         retroflexion view. Recommendation:        - Discharge patient to home (with escort).                        - Resume previous diet today.                        - Continue present medications.                        - Await pathology results.                        - Repeat colonoscopy in 3 years for surveillance of                         multiple polyps. Procedure Code(s):     --- Professional ---                        [phone], Colonoscopy, flexible; with removal of                         tumor(s), polyp(s), or other lesion(s) by snare                         technique Diagnosis Code(s):     --- Professional ---  D12.0, Benign neoplasm of cecum                        D12.8, Benign neoplasm of rectum                        D12.4, Benign neoplasm of descending colon                        R19.5, Other fecal abnormalities CPT copyright 2022 American Medical Association. All rights reserved. The codes documented in this report are preliminary and upon coder review may  be revised to meet current compliance requirements. Dr. Ulyess Mort Lin Landsman MD, MD 05/16/2022 10:32:07 AM This report has been signed electronically. Number of Addenda: 0 Note Initiated On: 05/16/2022 9:58 AM Scope Withdrawal Time: 0 hours 12 minutes 39 seconds  Total Procedure Duration: 0 hours 14 minutes 43 seconds  Estimated Blood Loss:  Estimated blood loss: none. Estimated blood loss: none.      [facility]

## 2022-05-16 NOTE — Anesthesia Preprocedure Evaluation (Signed)
Anesthesia Evaluation  Patient identified by MRN, date of birth, ID band Patient awake    Reviewed: Allergy & Precautions, NPO status , Patient's Chart, lab work & pertinent test results  Airway Mallampati: III  TM Distance: <3 FB Neck ROM: full    Dental  (+) Chipped   Pulmonary neg shortness of breath, asthma    Pulmonary exam normal        Cardiovascular Exercise Tolerance: Good (-) angina negative cardio ROS Normal cardiovascular exam     Neuro/Psych negative neurological ROS  negative psych ROS   GI/Hepatic negative GI ROS, Neg liver ROS,neg GERD  ,,  Endo/Other    Morbid obesity  Renal/GU negative Renal ROS  negative genitourinary   Musculoskeletal   Abdominal   Peds  Hematology negative hematology ROS (+)   Anesthesia Other Findings Past Medical History: No date: Asthma  Past Surgical History: No date: WISDOM TOOTH EXTRACTION  BMI    Body Mass Index: 40.89 kg/m      Reproductive/Obstetrics negative OB ROS                             Anesthesia Physical Anesthesia Plan  ASA: 3  Anesthesia Plan: General   Post-op Pain Management:    Induction: Intravenous  PONV Risk Score and Plan: Propofol infusion and TIVA  Airway Management Planned: Natural Airway and Nasal Cannula  Additional Equipment:   Intra-op Plan:   Post-operative Plan:   Informed Consent: I have reviewed the patients History and Physical, chart, labs and discussed the procedure including the risks, benefits and alternatives for the proposed anesthesia with the patient or authorized representative who has indicated his/her understanding and acceptance.     Dental Advisory Given  Plan Discussed with: Anesthesiologist, CRNA and Surgeon  Anesthesia Plan Comments: (Patient consented for risks of anesthesia including but not limited to:  - adverse reactions to medications - risk of airway placement  if required - damage to eyes, teeth, lips or other oral mucosa - nerve damage due to positioning  - sore throat or hoarseness - Damage to heart, brain, nerves, lungs, other parts of body or loss of life  Patient voiced understanding.)       Anesthesia Quick Evaluation

## 2022-05-16 NOTE — Transfer of Care (Signed)
Immediate Anesthesia Transfer of Care Note  Patient: Dwayne Hooper  Procedure(s) Performed: COLONOSCOPY WITH PROPOFOL  Patient Location: PACU  Anesthesia Type:General  Level of Consciousness: awake and alert   Airway & Oxygen Therapy: Patient Spontanous Breathing  Post-op Assessment: Report given to RN and Post -op Vital signs reviewed and stable  Post vital signs: Reviewed and stable  Last Vitals:  Vitals Value Taken Time  BP 110/72 05/16/22 1034  Temp 35.7 C 05/16/22 1034  Pulse 63 05/16/22 1034  Resp 18 05/16/22 1034  SpO2 99 % 05/16/22 1034    Last Pain:  Vitals:   05/16/22 1034  TempSrc: Temporal  PainSc:          Complications: No notable events documented.

## 2022-05-16 NOTE — Anesthesia Procedure Notes (Signed)
Date/Time: 05/16/2022 10:07 AM  Performed by: Johnna Acosta, CRNAPre-anesthesia Checklist: Patient identified, Emergency Drugs available, Suction available, Patient being monitored and Timeout performed Patient Re-evaluated:Patient Re-evaluated prior to induction Oxygen Delivery Method: Nasal cannula Preoxygenation: Pre-oxygenation with 100% oxygen Induction Type: IV induction

## 2022-05-19 ENCOUNTER — Encounter: Payer: Self-pay | Admitting: Gastroenterology

## 2022-05-19 LAB — SURGICAL PATHOLOGY

## 2023-09-15 DIAGNOSIS — E119 Type 2 diabetes mellitus without complications: Secondary | ICD-10-CM | POA: Insufficient documentation

## 2023-11-10 DIAGNOSIS — M25561 Pain in right knee: Secondary | ICD-10-CM | POA: Insufficient documentation

## 2024-04-18 ENCOUNTER — Ambulatory Visit: Payer: Self-pay | Admitting: Physician Assistant

## 2024-04-18 ENCOUNTER — Ambulatory Visit (INDEPENDENT_AMBULATORY_CARE_PROVIDER_SITE_OTHER)

## 2024-04-18 ENCOUNTER — Ambulatory Visit: Admission: EM | Admit: 2024-04-18 | Discharge: 2024-04-18 | Disposition: A | Discharge status: Home / Self Care

## 2024-04-18 DIAGNOSIS — R0789 Other chest pain: Secondary | ICD-10-CM

## 2024-04-18 DIAGNOSIS — S299XXA Unspecified injury of thorax, initial encounter: Secondary | ICD-10-CM | POA: Diagnosis not present

## 2024-04-18 NOTE — ED Provider Notes (Signed)
 [facility]    CSN: [id number] Arrival date & time: 04/18/24  9073      History   Chief Complaint Chief Complaint  Patient presents with   Fall   Rib Injury    HPI Dwayne Hooper is a 53 y.o. male presenting for right-sided rib pain for the past 5 days.  Patient reports tripping on something and hitting the right side of his ribs on the edge of a couch.  He states he had severe pain the day following the injury.  Never had any associated swelling, bruising or wounds.  He says symptoms have gradually improved since then and he no longer has pain when coughing or breathing.  He has been taking naproxen  and a muscle relaxer.  He says he is able to sleep okay at night with the help of the muscle relaxer.  Patient reports he still does not feel well enough to go back to work but overall has improved significantly.  He would also like to rule out any potential underlying fractures.  No other complaints.  HPI  Past Medical History:  Diagnosis Date   Asthma     Patient Active Problem List   Diagnosis Date Noted   Arthralgia of right knee 11/10/2023   Type 2 diabetes mellitus without complications (HCC) 09/15/2023   Positive colorectal cancer screening using Cologuard test 05/16/2022   Polyp of colon 05/16/2022   Osteoarthritis of both knees 11/03/2016   Osteoarthritis of knee 11/03/2016    Past Surgical History:  Procedure Laterality Date   COLONOSCOPY WITH PROPOFOL  N/A 05/16/2022   Procedure: COLONOSCOPY WITH PROPOFOL ;  Surgeon: Unk Corinn Skiff, MD;  Location: [facility] ENDOSCOPY;  Service: Gastroenterology;  Laterality: N/A;   WISDOM TOOTH EXTRACTION         Home Medications    Prior to Admission medications   Medication Sig Start Date End Date Taking? Authorizing Provider  albuterol  (VENTOLIN  HFA) 108 (90 Base) MCG/ACT inhaler Inhale 1-2 puffs into the lungs every 6 (six) hours as needed for wheezing or shortness of breath. 05/01/21   Rodriguez-Southworth,  Sylvia, PA-C  ezetimibe (ZETIA) 10 MG tablet Take 10 mg by mouth daily.    [provider]  lisinopril-hydrochlorothiazide (PRINZIDE,ZESTORETIC) 10-12.5 MG tablet Take 1 tablet by mouth daily. 09/06/15   [provider]  methylPREDNISolone  (MEDROL  DOSEPAK) 4 MG TBPK tablet Take as directed 05/01/21   Rodriguez-Southworth, Kyra, PA-C  Multiple Vitamins-Minerals (MULTIVITAMIN WITH MINERALS) tablet Take 1 tablet by mouth daily.    [provider]  naproxen  (NAPROSYN ) 500 MG tablet Take 1 tablet (500 mg total) by mouth 2 (two) times daily with a meal. 10/28/16   Lacinda Elsie SHAUNNA, PA-C  rosuvastatin (CRESTOR) 10 MG tablet Take 10 mg by mouth daily.    [provider]    Family History Family History  Problem Relation Age of Onset   Healthy Mother    Healthy Father     Social History Social History   Tobacco Use   Smoking status: Never   Smokeless tobacco: Current    Types: Snuff  Vaping Use   Vaping status: Never Used  Substance Use Topics   Alcohol use: Yes    Comment: occasionally   Drug use: No     Allergies   Patient has no known allergies.   Review of Systems Review of Systems  Respiratory:  Negative for cough and shortness of breath.   Cardiovascular:  Negative for chest pain.  Gastrointestinal:  Negative for abdominal  pain and vomiting.  Musculoskeletal:  Positive for arthralgias (right rib pain). Negative for back pain and neck pain.  Skin:  Negative for color change and wound.  Neurological:  Negative for syncope, weakness and headaches.     Physical Exam Triage Vital Signs ED Triage Vitals  Encounter Vitals Group     BP 04/18/24 1035 116/74     Girls Systolic BP Percentile --      Girls Diastolic BP Percentile --      Boys Systolic BP Percentile --      Boys Diastolic BP Percentile --      Pulse Rate 04/18/24 1035 65     Resp 04/18/24 1035 16     Temp 04/18/24 1035 98.6 F (37 C)     Temp Source 04/18/24 1035 Oral      SpO2 04/18/24 1035 95 %     Weight 04/18/24 1034 292 lb (132.5 kg)     Height 04/18/24 1034 5' 11 (1.803 m)     Head Circumference --      Peak Flow --      Pain Score 04/18/24 1038 4     Pain Loc --      Pain Education --      Exclude from Growth Chart --    No data found.  Updated Vital Signs BP 116/74 (BP Location: Right Arm)   Pulse 65   Temp 98.6 F (37 C) (Oral)   Resp 16   Ht 5' 11 (1.803 m)   Wt 292 lb (132.5 kg)   SpO2 95%   BMI 40.73 kg/m      Physical Exam Vitals and nursing note reviewed.  Constitutional:      General: He is not in acute distress.    Appearance: Normal appearance. He is well-developed. He is not ill-appearing.  HENT:     Head: Normocephalic and atraumatic.  Eyes:     General: No scleral icterus.    Conjunctiva/sclera: Conjunctivae normal.  Cardiovascular:     Rate and Rhythm: Normal rate and regular rhythm.  Pulmonary:     Effort: Pulmonary effort is normal. No respiratory distress.     Breath sounds: Normal breath sounds.  Chest:     Chest wall: Tenderness (right lateral ribs) present.  Abdominal:     Palpations: Abdomen is soft.     Tenderness: There is no abdominal tenderness.  Musculoskeletal:     Cervical back: Neck supple.  Skin:    General: Skin is warm and dry.     Capillary Refill: Capillary refill takes less than 2 seconds.  Neurological:     General: No focal deficit present.     Mental Status: He is alert. Mental status is at baseline.     Motor: No weakness.     Gait: Gait normal.  Psychiatric:        Mood and Affect: Mood normal.        Behavior: Behavior normal.      UC Treatments / Results  Labs (all labs ordered are listed, but only abnormal results are displayed) Labs Reviewed - No data to display  EKG   Radiology No results found.  Procedures Procedures (including critical care time)  Medications Ordered in UC Medications - No data to display  Initial Impression / Assessment and Plan / UC  Course  I have reviewed the triage vital signs and the nursing notes.  Pertinent labs & imaging results that were available during my care of the patient  were reviewed by me and considered in my medical decision making (see chart for details).   53 year old male presents for right rib pain following injury that occurred 5 days ago.  Symptoms have improved with naproxen  and a muscle relaxer as well as rest.  He denies pain or difficulty with breathing.  X-ray of right ribs obtained.  Wet read negative.  Reviewed with patient.  Right rib contusion.  Advised continuing naproxen , ice, rest and muscle relaxer if needed.  Thoroughly reviewed return precautions.  Also, advised patient we will call him if the radiology over read differs from mine.  Work note given.  Radiology over read negative. Results communicated to patient through MyChart.    Final Clinical Impressions(s) / UC Diagnoses   Final diagnoses:  Rib pain on right side  Rib injury     Discharge Instructions      -I will call if x-ray shows fracture or other concerning abnormality -Continue with NSAIDs and muscle relaxers. Likely bruised rib or sprain -If pain is worsening or you feel short of breath, please return     ED Prescriptions   None    PDMP not reviewed this encounter.   Arvis Jolan NOVAK, PA-C 04/18/24 1152

## 2024-04-18 NOTE — ED Triage Notes (Signed)
 Pt c/o R sided rib pain d/t fall 5 days ago. States [place]pped in [place] & hit side of couch. Has tried OTC meds w/o relief.

## 2024-04-18 NOTE — Discharge Instructions (Signed)
-  I will call if x-ray shows fracture or other concerning abnormality -Continue with NSAIDs and muscle relaxers. Likely bruised rib or sprain -If pain is worsening or you feel short of breath, please return

## 2024-04-20 ENCOUNTER — Ambulatory Visit: Payer: Self-pay | Admitting: Nurse Practitioner

## 2024-07-04 ENCOUNTER — Ambulatory Visit: Admitting: Nurse Practitioner
